# Patient Record
Sex: Male | Born: 1960 | Race: White | Hispanic: No | Marital: Single | Smoking: Never smoker
Health system: Southern US, Community
[De-identification: ages and names within clinical notes are randomized; demographics above are authoritative.]

## PROBLEM LIST (undated history)

## (undated) ENCOUNTER — Emergency Department (HOSPITAL_COMMUNITY): Disposition: A | Discharge status: Home / Self Care | Payer: Self-pay

## (undated) DIAGNOSIS — G039 Meningitis, unspecified: Secondary | ICD-10-CM

## (undated) DIAGNOSIS — M549 Dorsalgia, unspecified: Secondary | ICD-10-CM

## (undated) HISTORY — PX: KNEE SURGERY: SHX244

## (undated) HISTORY — PX: ELBOW SURGERY: SHX618

## (undated) HISTORY — PX: SHOULDER SURGERY: SHX246

---

## 2015-01-02 ENCOUNTER — Ambulatory Visit: Payer: Self-pay | Admitting: Sports Medicine

## 2015-01-21 ENCOUNTER — Ambulatory Visit: Payer: Self-pay | Admitting: Sports Medicine

## 2015-04-20 ENCOUNTER — Telehealth: Payer: Self-pay | Admitting: Internal Medicine

## 2015-04-20 ENCOUNTER — Ambulatory Visit (INDEPENDENT_AMBULATORY_CARE_PROVIDER_SITE_OTHER): Payer: BC Managed Care – PPO | Admitting: Internal Medicine

## 2015-04-20 VITALS — BP 120/80 | HR 75 | Temp 98.5°F | Ht 73.25 in | Wt 200.0 lb

## 2015-04-20 DIAGNOSIS — J988 Other specified respiratory disorders: Secondary | ICD-10-CM | POA: Diagnosis not present

## 2015-04-20 DIAGNOSIS — J22 Unspecified acute lower respiratory infection: Secondary | ICD-10-CM

## 2015-04-20 MED ORDER — AZITHROMYCIN 250 MG PO TABS: ORAL_TABLET | ORAL | Status: DC

## 2015-04-20 MED ORDER — HYDROCODONE-HOMATROPINE 5-1.5 MG/5ML PO SYRP: 5.0000 mL | ORAL_SOLUTION | Freq: Four times a day (QID) | ORAL | Status: DC | PRN

## 2015-04-20 NOTE — Progress Notes (Signed)
   Subjective:  By signing my name below, I, Stann Oresung-Kai Tsai, attest that this documentation has been prepared under the direction and in the presence of Ellamae Siaobert Deneen Slager, MD. Electronically Signed: Stann Oresung-Kai Tsai, Scribe. 04/20/2015 , 12:14 PM .  Patient was seen in Room 12 .   Patient ID: Bobby Miles, male    DOB: [birth year], 55 y.o.   MRN: [id number] Chief Complaint  Patient presents with  . Cough    flu like symptomsx 9-10 days, now cough-productive w/yellow to dark gray product  . Chills    felt hot/cold   . Sore Throat   HPI Bobby Miles is a 55 y.o. male who [facility] complaining of productive (yellow to dark gray) coughs with flu-like symptoms that started 9-10 days ago. He went to [place] to visit family and returned about 10 days ago. He also notes having chills and feeling fatigue. He had sore throat and fever but this has resolved. His wife has similar but worse symptoms. He denies shortness of breath.   There are no active problems to display for this patient.  No current outpatient prescriptions on file.  Review of Systems  Constitutional: Positive for chills and fatigue. Negative for fever.  HENT: Positive for congestion. Negative for sinus pressure, sneezing and sore throat.   Respiratory: Positive for cough. Negative for chest tightness, shortness of breath and wheezing.   Gastrointestinal: Negative for nausea, vomiting and abdominal pain.       Objective:   Physical Exam  Constitutional: He is oriented to person, place, and time. He appears well-developed and well-nourished. No distress.  HENT:  Head: Normocephalic and atraumatic.  Nose: Nose normal. No rhinorrhea.  Mouth/Throat: Oropharynx is clear and moist. No oropharyngeal exudate.  Eyes: EOM are normal. Pupils are equal, round, and reactive to light.  Neck: Neck supple.  Cardiovascular: Normal rate.   Pulmonary/Chest: Effort normal. No respiratory distress. He has no wheezes. He has rhonchi  (bilaterally ).  Musculoskeletal: Normal range of motion.  Lymphadenopathy:    He has no cervical adenopathy.  Neurological: He is alert and oriented to person, place, and time.  Skin: Skin is warm and dry.  Psychiatric: He has a normal mood and affect. His behavior is normal.  Nursing note and vitals reviewed.   BP 120/80 mmHg  Pulse 75  Temp(Src) 98.5 F (36.9 C) (Oral)  Ht 6' 1.25" (1.861 m)  Wt 200 lb (90.719 kg)  BMI 26.19 kg/m2  SpO2 98%     Assessment & Plan:  I have completed the patient encounter in its entirety as documented by the scribe, with editing by me where necessary. Hennessey Cantrell P. Merla Richesoolittle, M.D. Lower respiratory infection Meds ordered this encounter  Medications  . azithromycin (ZITHROMAX) 250 MG tablet    Sig: As packaged    Dispense:  6 tablet    Refill:  0  . HYDROcodone-homatropine (HYCODAN) 5-1.5 MG/5ML syrup    Sig: Take 5 mLs by mouth every 6 (six) hours as needed.    Dispense:  240 mL    Refill:  0   Fu 1 week not well

## 2015-04-21 NOTE — Telephone Encounter (Signed)
xxx

## 2015-10-28 ENCOUNTER — Ambulatory Visit: Payer: Self-pay | Admitting: Sports Medicine

## 2015-10-29 ENCOUNTER — Ambulatory Visit (INDEPENDENT_AMBULATORY_CARE_PROVIDER_SITE_OTHER): Payer: BC Managed Care – PPO | Admitting: Sports Medicine

## 2015-10-29 ENCOUNTER — Encounter: Payer: Self-pay | Admitting: Sports Medicine

## 2015-10-29 ENCOUNTER — Ambulatory Visit
Admission: RE | Admit: 2015-10-29 | Discharge: 2015-10-29 | Disposition: A | Discharge status: Home / Self Care | Payer: BC Managed Care – PPO | Source: Ambulatory Visit | Attending: Sports Medicine | Admitting: Sports Medicine

## 2015-10-29 VITALS — BP 114/83 | Ht 73.0 in | Wt 200.0 lb

## 2015-10-29 DIAGNOSIS — M545 Low back pain, unspecified: Secondary | ICD-10-CM
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
## 2015-10-29 NOTE — Assessment & Plan Note (Signed)
Most likely he had an underlying organic cause to his back pain with his long history.  He has good strength and range of motion which is contributed from his participation in yoga.  - Lumbar x-rays  - encouraged continuing exercises and stretching.  - can take NSAIDS as needed for pain.  - pending x-rays may need to move further to MRI lumbar spine.

## 2015-10-29 NOTE — Patient Instructions (Signed)
Thank you for coming in,   Try to do the [facility] exercises.  Perform knee to chest, knee to opposite shoulder, and arm and leg extensions.   I will call with the results from today.   Keep up with the yoga.    Please feel free to call with any questions or concerns at any time, at [phone]. --Dr. Jordan LikesSchmitz

## 2015-10-30 ENCOUNTER — Telehealth: Payer: Self-pay | Admitting: Family Medicine

## 2015-10-30 NOTE — Telephone Encounter (Signed)
Gave the patient the results of his xrays and also letter for yoga therapy

## 2015-10-30 NOTE — Telephone Encounter (Signed)
Left VM for patient. If he calls back please have him speak with a nurse/CMA and inform that his x-rays show a loss of normal curative of his spine in the lumbar region and Dr. Darrick PennaFields thinks he probably had some spondylitic change at L5/S1 that has healed. There was also some mild bone spurring observed.  We will continue the exercises that we provided for the next 6 to 12 weeks and he should follow up if there is no improvement.    If any questions then please take the best time and phone number to call and I will try to call him back.   Myra RudeJeremy E Schmitz, MD PGY-4, [facility] 10/30/2015, 11:10 AM

## 2016-07-07 ENCOUNTER — Other Ambulatory Visit: Payer: Self-pay | Admitting: Family Medicine

## 2016-07-07 DIAGNOSIS — N50819 Testicular pain, unspecified: Secondary | ICD-10-CM

## 2017-05-24 ENCOUNTER — Encounter: Payer: Self-pay | Admitting: Sports Medicine

## 2017-05-24 ENCOUNTER — Ambulatory Visit
Admission: RE | Admit: 2017-05-24 | Discharge: 2017-05-24 | Disposition: A | Discharge status: Home / Self Care | Payer: BC Managed Care – PPO | Source: Ambulatory Visit | Attending: Sports Medicine | Admitting: Sports Medicine

## 2017-05-24 ENCOUNTER — Ambulatory Visit: Payer: BC Managed Care – PPO | Admitting: Sports Medicine

## 2017-05-24 VITALS — BP 125/90 | Ht 73.0 in | Wt 200.0 lb

## 2017-05-24 DIAGNOSIS — G8929 Other chronic pain: Secondary | ICD-10-CM

## 2017-05-24 DIAGNOSIS — M25511 Pain in right shoulder: Secondary | ICD-10-CM

## 2017-05-24 DIAGNOSIS — M25512 Pain in left shoulder: Secondary | ICD-10-CM

## 2017-05-24 NOTE — Patient Instructions (Signed)
It was nice meeting you today Mr. Gwendolyn FillMcGuire!  We will call you when the results of your xrays are available to discuss the next steps in your treatment plan.   If you have any questions or concerns, please feel free to call the clinic.   Be well,  Dr. Natale MilchLancaster

## 2017-05-24 NOTE — Assessment & Plan Note (Signed)
Possibly 2/2 prior surgeries, though no abnormalities noted on ultrasound. No arthritic changes noted. No weakness on exam, so less likely rotator cuff injury. Avascular necrosis is included on differential given no other obvious etiology and no physical exam abnormalities, however no history of excessive steroid use or other obvious precipitating factor. As no findings on ultrasound, will proceed with bilateral shoulder plain film series. If no cause for pain noted on plain film, will continue with MRI. Will call patient when xray results available to discuss next steps of treatment plan.

## 2017-05-24 NOTE — Progress Notes (Signed)
   Subjective:    Patient ID: Bobby Miles, male    DOB: June 06, 1960, 57 y.o.   MRN: [id number]  HPI  Bobby DaftMichael Barrientez is a 57yo man with history of bilateral shoulder surgery presenting with chronic shoulder pain.  Has had bilateral shoulder pain for many years, but has recently worsened over the past 6 months. Cannot identify precise location of pain, but says pain is more generalized. Initially he was having difficulty sleeping at night due to pain, which has improved with activity modifications. Patient has stopped doing pushups and other exercises which seem to strain his shoulders at the gym with improvement in symptoms, however if he tries to resume these exercises the pain quickly returns. Also has both pain and weakness when reaching for objects behind him, even light objects such as a pillow. Has not taken any medications for the pain, or used ice or heat. Denies redness or swelling of shoulders. In the past has received injections for shoulder pain, which he says were very helpful for 1-2 months, however he is interested in a long term solution rather than a temporary fix. Is interested in getting imaging today to try to identify the problem.  Of note, shoulder surgeries ~10-15 years ago. Is unsure exactly why he had surgery, however says he remembers being told her had a bone spur, rotator cuff damage, calcium build up, and a portion of his scapula removed. Has a history of back pain as well for which he was seen here in 2017. Started doing yoga which has significant improved his back pain, though does aggravate his shoulders at times.   Review of Systems MSK: Endorses weakness of upper extremities bilaterally. Denies numbness, tingling of upper extremities.  Skin: Denies redness or swelling of shoulders bilaterally.     Objective:   Physical Exam  Constitutional: He is oriented to person, place, and time. He appears well-developed and well-nourished. No distress.  Pulmonary/Chest:  Effort normal. No respiratory distress.  Musculoskeletal:  5/5 strength upper extremities bilaterally. Negative empty can, Hawkin's, Neer's. No TTP. Mild asymmetry suggestive of possible atrophy on L shoulder.   Neurological: He is alert and oriented to person, place, and time.  Psychiatric: He has a normal mood and affect. His behavior is normal.      Assessment & Plan:  Chronic pain of both shoulders Possibly 2/2 prior surgeries, though no abnormalities noted on ultrasound. No arthritic changes noted. No weakness on exam, so less likely rotator cuff injury. Avascular necrosis is included on differential given no other obvious etiology and no physical exam abnormalities, however no history of excessive steroid use or other obvious precipitating factor. As no findings on ultrasound, will proceed with bilateral shoulder plain film series. If no cause for pain noted on plain film, will continue with MRI. Will call patient when xray results available to discuss next steps of treatment plan.   Tarri AbernethyAbigail J Lancaster, MD, MPH PGY-3 [facility] Pager [phone]  I observed and examined the patient with the resident and agree with assessment and plan.  Note reviewed and modified by me. Sterling BigKB Adelae Yodice, MD

## 2017-12-15 ENCOUNTER — Other Ambulatory Visit: Payer: Self-pay | Admitting: *Deleted

## 2017-12-15 DIAGNOSIS — M25511 Pain in right shoulder: Principal | ICD-10-CM

## 2017-12-15 DIAGNOSIS — M25512 Pain in left shoulder: Principal | ICD-10-CM

## 2017-12-15 DIAGNOSIS — G8929 Other chronic pain: Secondary | ICD-10-CM

## 2018-01-17 ENCOUNTER — Other Ambulatory Visit: Payer: BC Managed Care – PPO

## 2018-01-17 ENCOUNTER — Ambulatory Visit
Admission: RE | Admit: 2018-01-17 | Discharge: 2018-01-17 | Disposition: A | Discharge status: Home / Self Care | Payer: BC Managed Care – PPO | Source: Ambulatory Visit | Attending: Sports Medicine | Admitting: Sports Medicine

## 2018-01-17 DIAGNOSIS — M25511 Pain in right shoulder: Principal | ICD-10-CM

## 2018-01-17 DIAGNOSIS — M25512 Pain in left shoulder: Principal | ICD-10-CM

## 2018-01-17 DIAGNOSIS — G8929 Other chronic pain: Secondary | ICD-10-CM

## 2018-01-24 ENCOUNTER — Ambulatory Visit
Admission: RE | Admit: 2018-01-24 | Discharge: 2018-01-24 | Disposition: A | Discharge status: Home / Self Care | Payer: BC Managed Care – PPO | Source: Ambulatory Visit | Attending: Sports Medicine | Admitting: Sports Medicine

## 2018-01-24 DIAGNOSIS — M25511 Pain in right shoulder: Principal | ICD-10-CM

## 2018-01-24 DIAGNOSIS — G8929 Other chronic pain: Secondary | ICD-10-CM

## 2018-01-24 DIAGNOSIS — M25512 Pain in left shoulder: Principal | ICD-10-CM

## 2018-02-07 ENCOUNTER — Ambulatory Visit: Payer: BC Managed Care – PPO | Admitting: Sports Medicine

## 2018-02-07 VITALS — BP 118/86 | Ht 73.0 in | Wt 200.0 lb

## 2018-02-07 DIAGNOSIS — M7581 Other shoulder lesions, right shoulder: Secondary | ICD-10-CM | POA: Diagnosis not present

## 2018-02-07 NOTE — Progress Notes (Signed)
   HPI  CC: Bilateral shoulder pain  Bobby Miles is a 57 year old male who presents for bilateral shoulder pain.  This is the same pain he has had for multiple years.  He underwent a bilateral acromioplasty around 3 years ago.  He has had a lingering shoulder pain since that time.  He was last seen in February in this clinic.  He had an MRI ordered at that time, which was done this month.  This showed tendinopathy of the supraspinatus muscle on bilateral shoulders as well as in for spinatus tendinopathy on the right shoulder.  He also had some acromial bursitis seen on MRI.  He states the pain is worse when he is in overhead activity.  He states also worse when he is lying on his right side at nighttime.  He is taken Aleve occasionally with some relief.  He denies any new trauma to the area.  See HPI and/or previous note for associated ROS.  Objective: BP 118/86   Ht 6\' 1"  (1.854 m)   Wt 200 lb (90.7 kg)   BMI 26.39 kg/m  Gen: Right-Hand Dominant. NAD, well groomed, a/o x3, normal affect.  CV: Well-perfused. Warm.  Resp: Non-labored.  Neuro: Sensation intact throughout. No gross coordination deficits.   Bilateral shoulder exam: No erythema, warmth, swelling noted.  Tenderness palpation over the bicipital groove and bilateral shoulders.  Full range of motion in forward flexion, abduction, internal and external rotation.  Strength 5 out of 5 throughout testing.  Some pain with abduction.  Positive Hawkins test bilaterally, negative speeds test bilaterally, positive empty can test bilaterally, negative crossover test, negative liftoff test, negative O'Brien's bilaterally.   Assessment and plan:  Bilateral shoulder pain, likely secondary to rotator cuff tendinopathy.  We discussed treatment options today with Bobby Miles.  We talked about doing subacromial injections into his shoulders, to give him relief to perform home exercises for rehabilitation.  He seemed amenable to this treatment plan.   Bobby Miles did leave his appointment early before the injections could be given.  I have called left a voicemail for follow-up appointment.  At that appointment I would consider discussing a subacromial injections with him again as well as given him a home exercise regimen for shoulder pain.  Alric Quan, MD [facility] Sports Medicine Fellow 02/07/2018 1:39 PM

## 2019-03-15 IMAGING — CR DG SHOULDER 2+V*R*
3 series · 3 of 3 positions shown · non-contrast
Comparison: None.

CLINICAL DATA: Chronic bilat shoulder pain w decreased ROM, past hx
of surg several yrs ago, no recent injury

EXAM:
RIGHT SHOULDER - 2+ VIEW

[w shoulder ap internal righ]
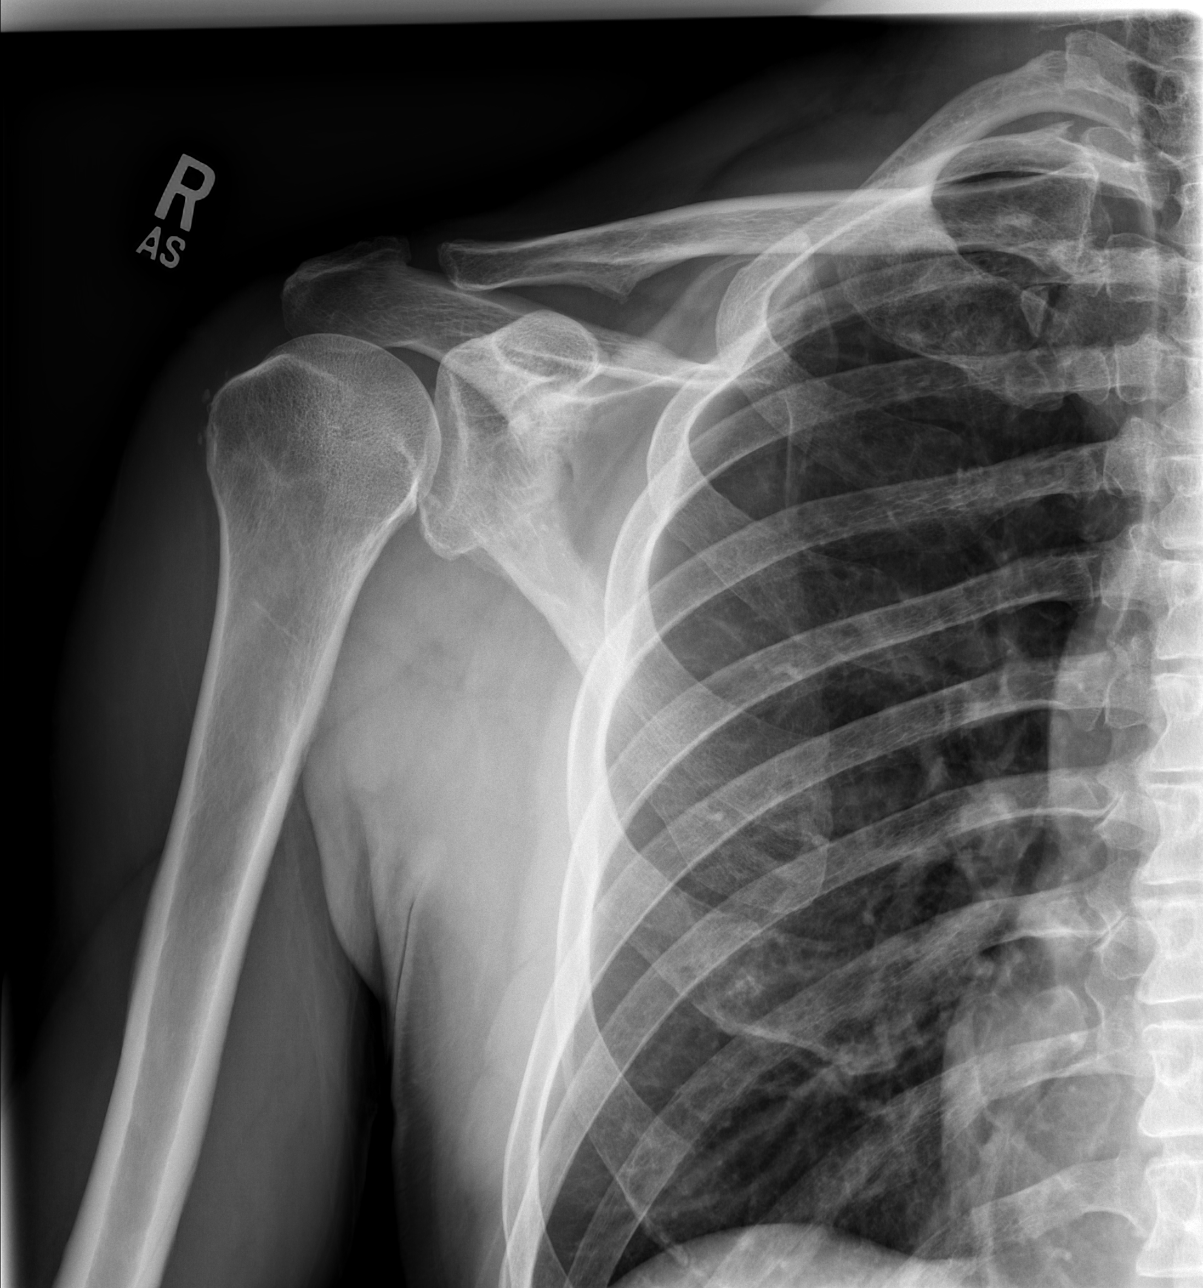

[w shoulder y view right]
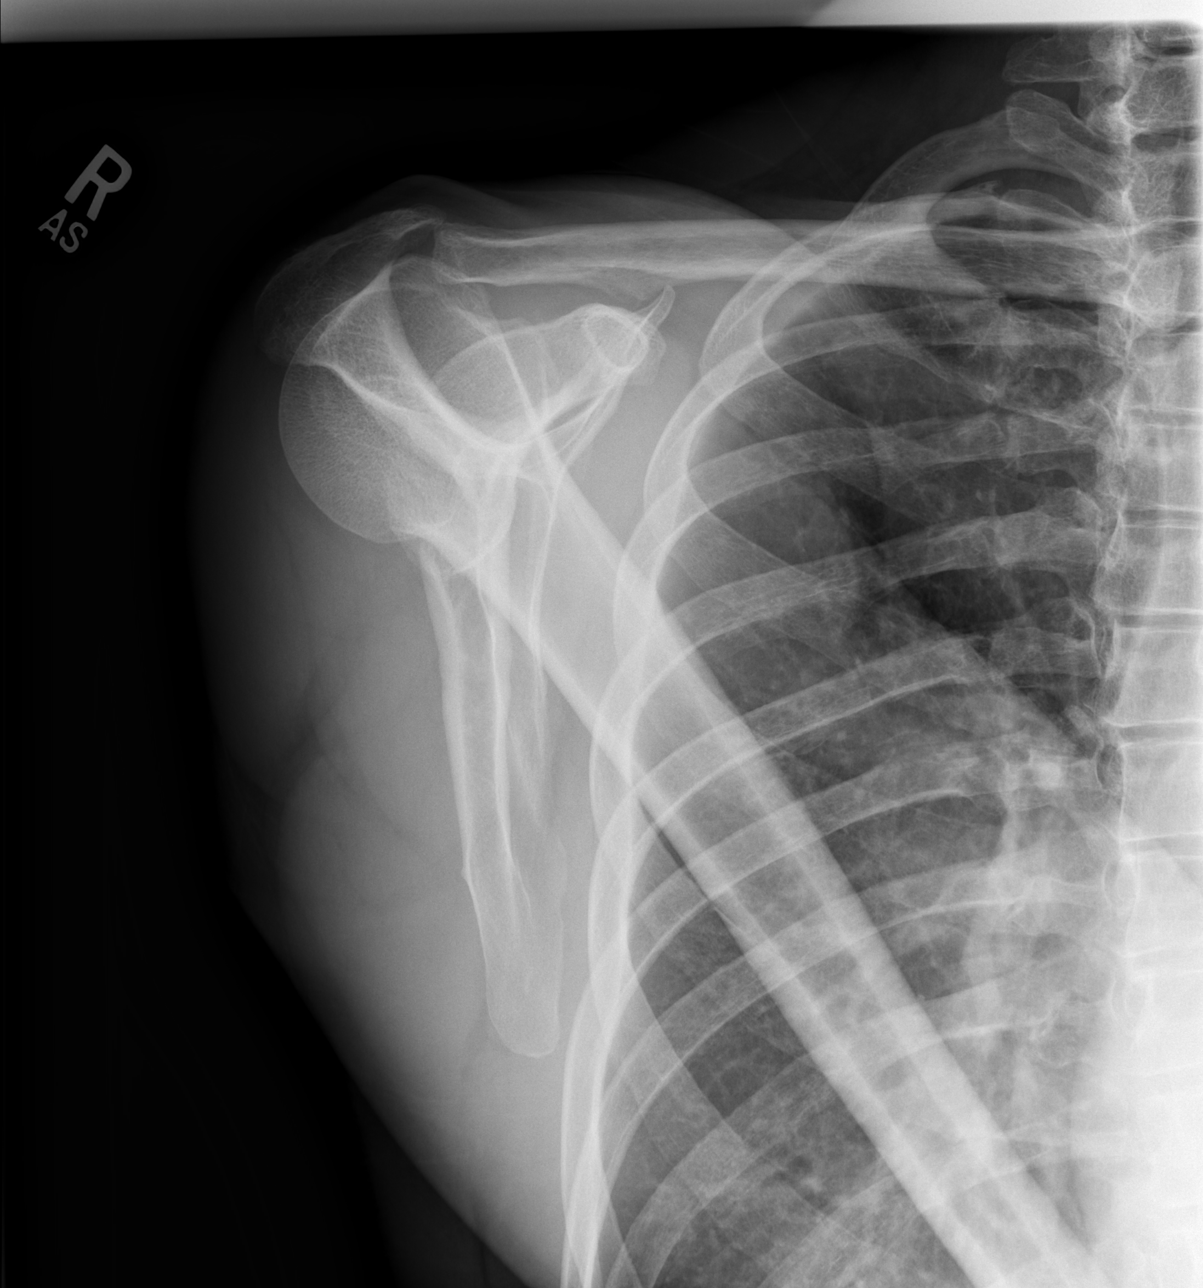

[w shoulder axillary right *]
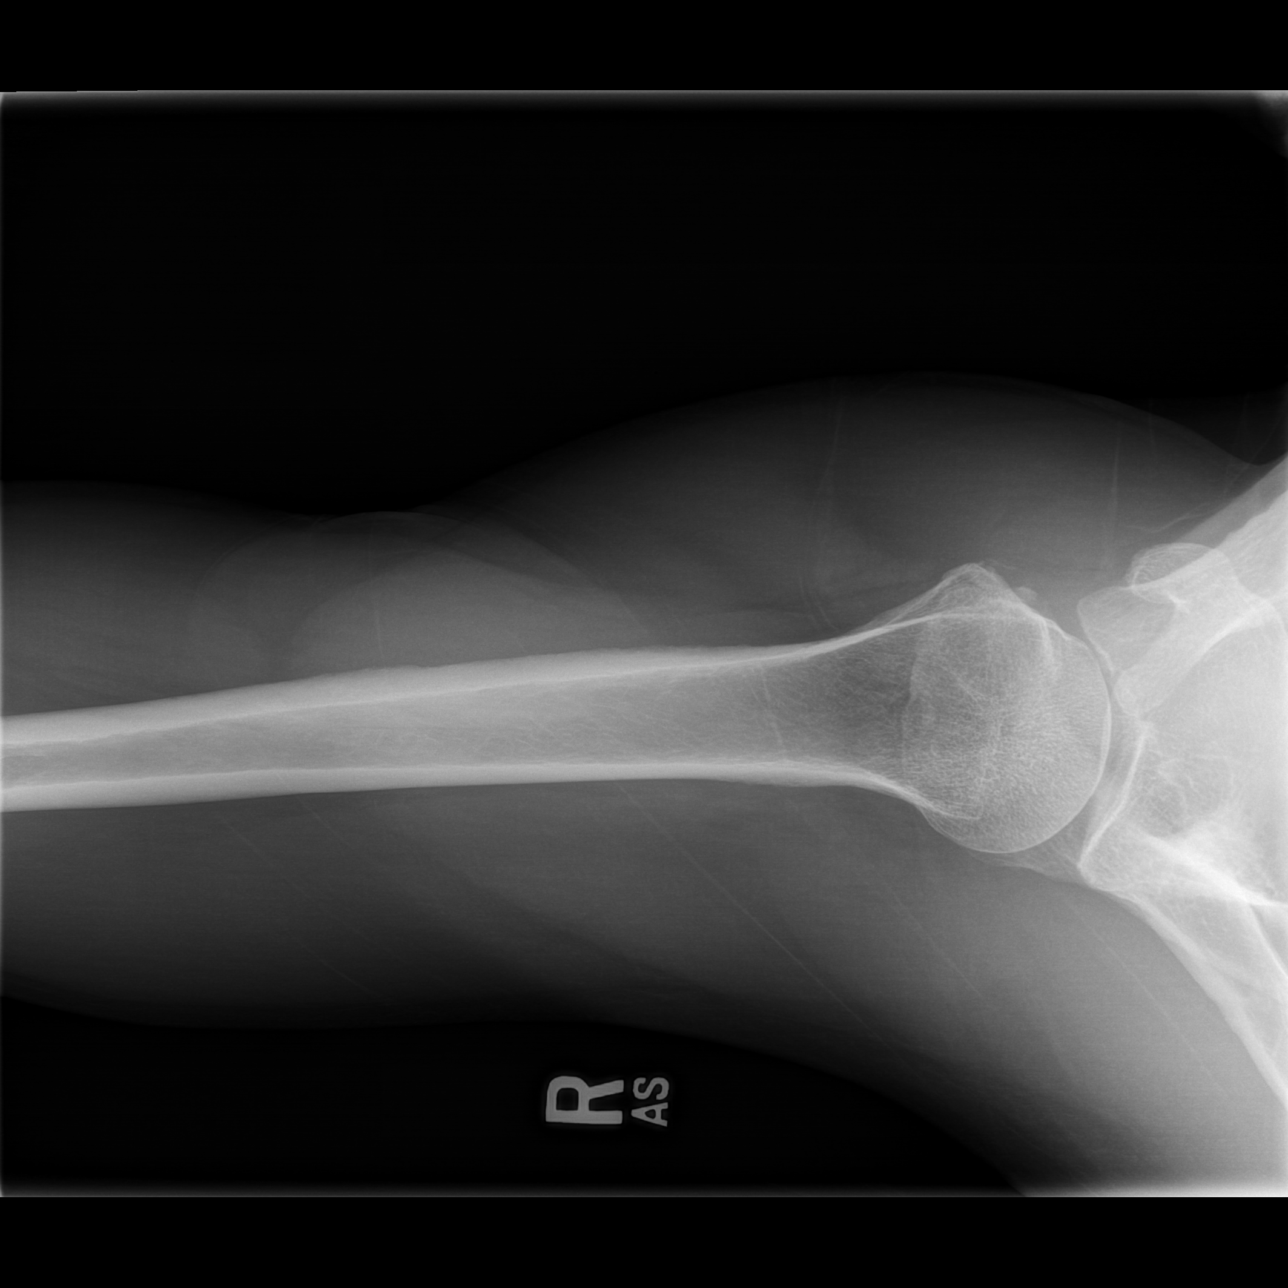

[3 of 3 positions shown; findings below may reference images not displayed]

FINDINGS: No fracture, bone lesion or dislocation.

The glenohumeral joint is normally spaced and aligned.

Mild widening of the AC joint. The acromion is decreased in size,
particularly its superior to inferior with, consistent with a prior
acromioplasty.

Soft tissues are unremarkable.
IMPRESSION: 1. No fracture or dislocation.
2. Changes consistent with the given history of previous right
shoulder surgery.
3. No significant arthropathic change.

## 2019-03-15 IMAGING — CR DG SHOULDER 2+V*L*
3 series · 3 of 3 positions shown · non-contrast
Comparison: None.

CLINICAL DATA: Chronic bilat shoulder pain w decreased ROM, past hx
of surg several yrs ago, no recent injury

EXAM:
LEFT SHOULDER - 2+ VIEW

[w shoulder ap internal left]
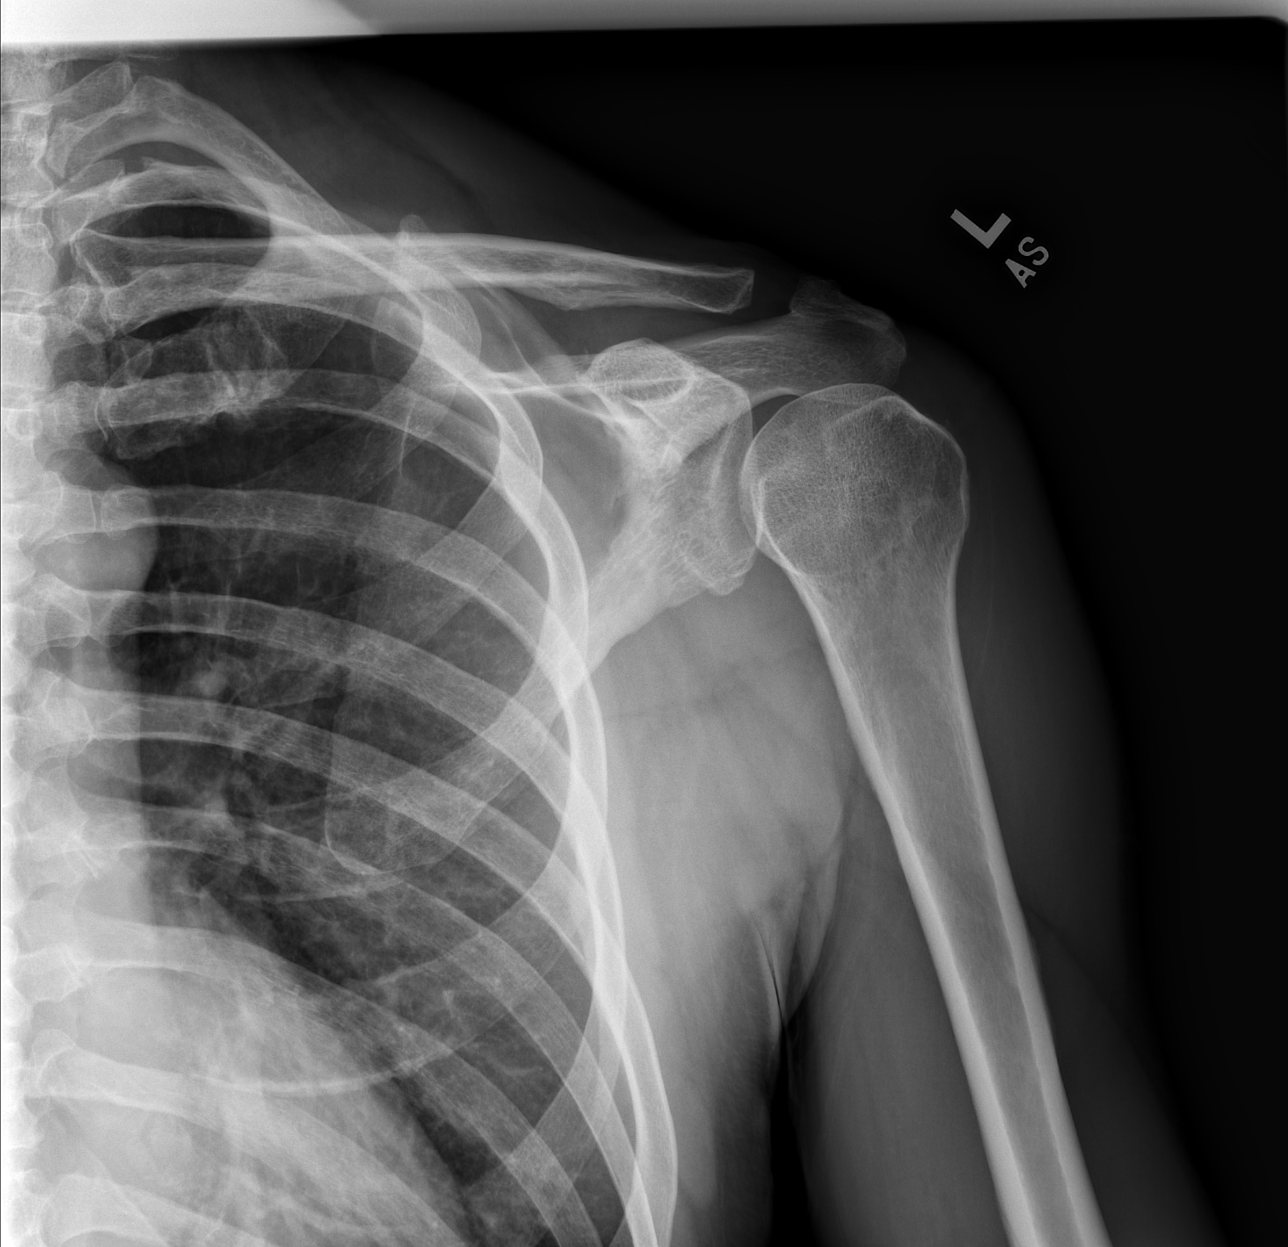

[w shoulder y view left]
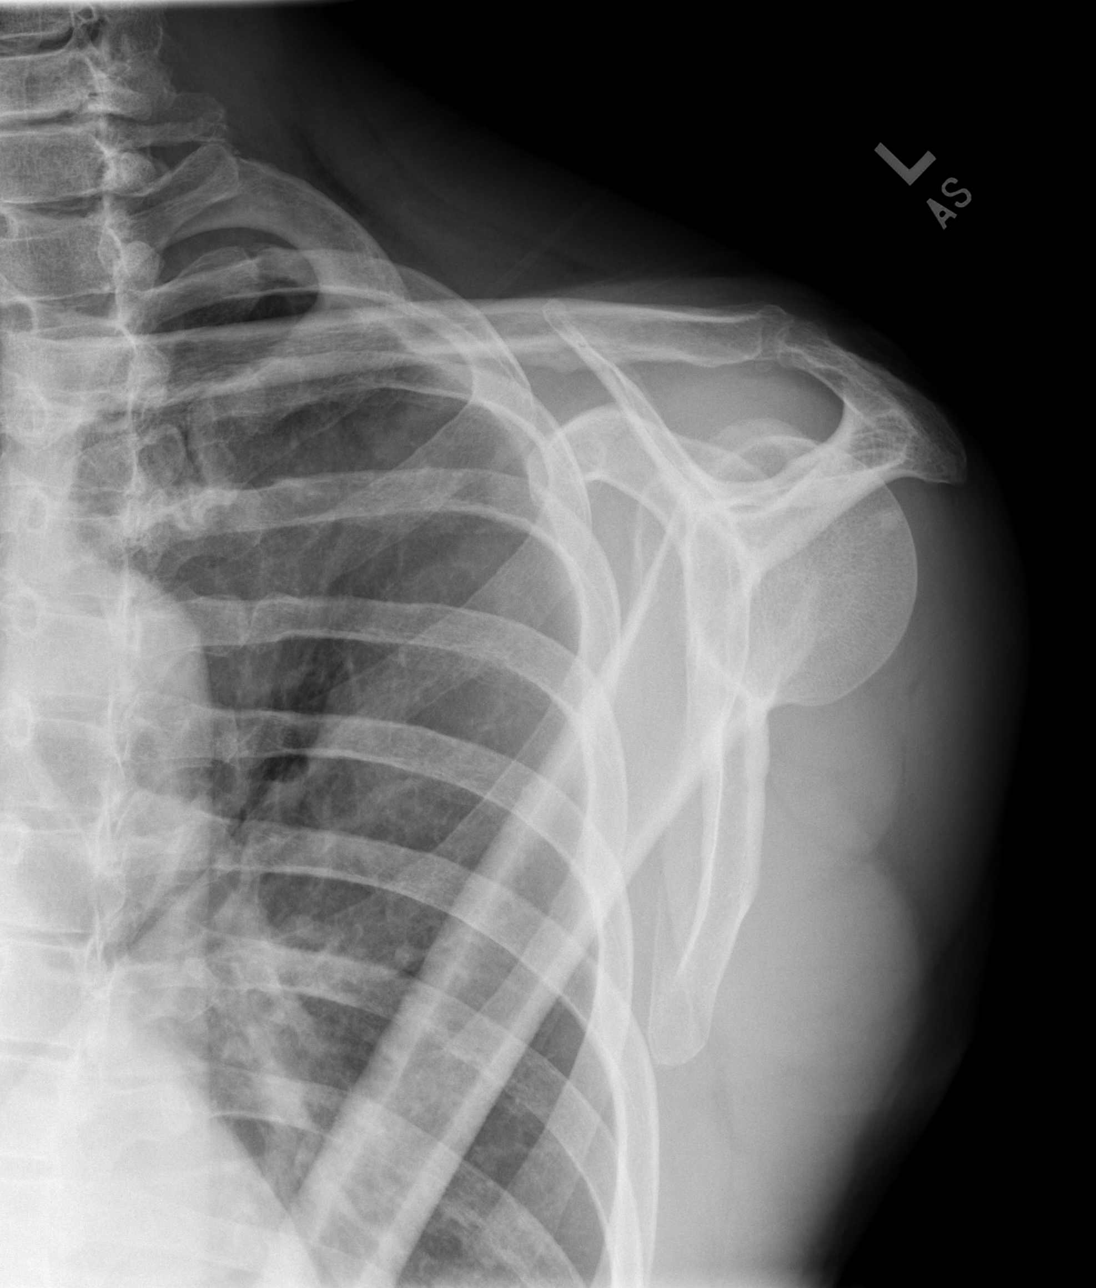

[w shoulder axillary left *]
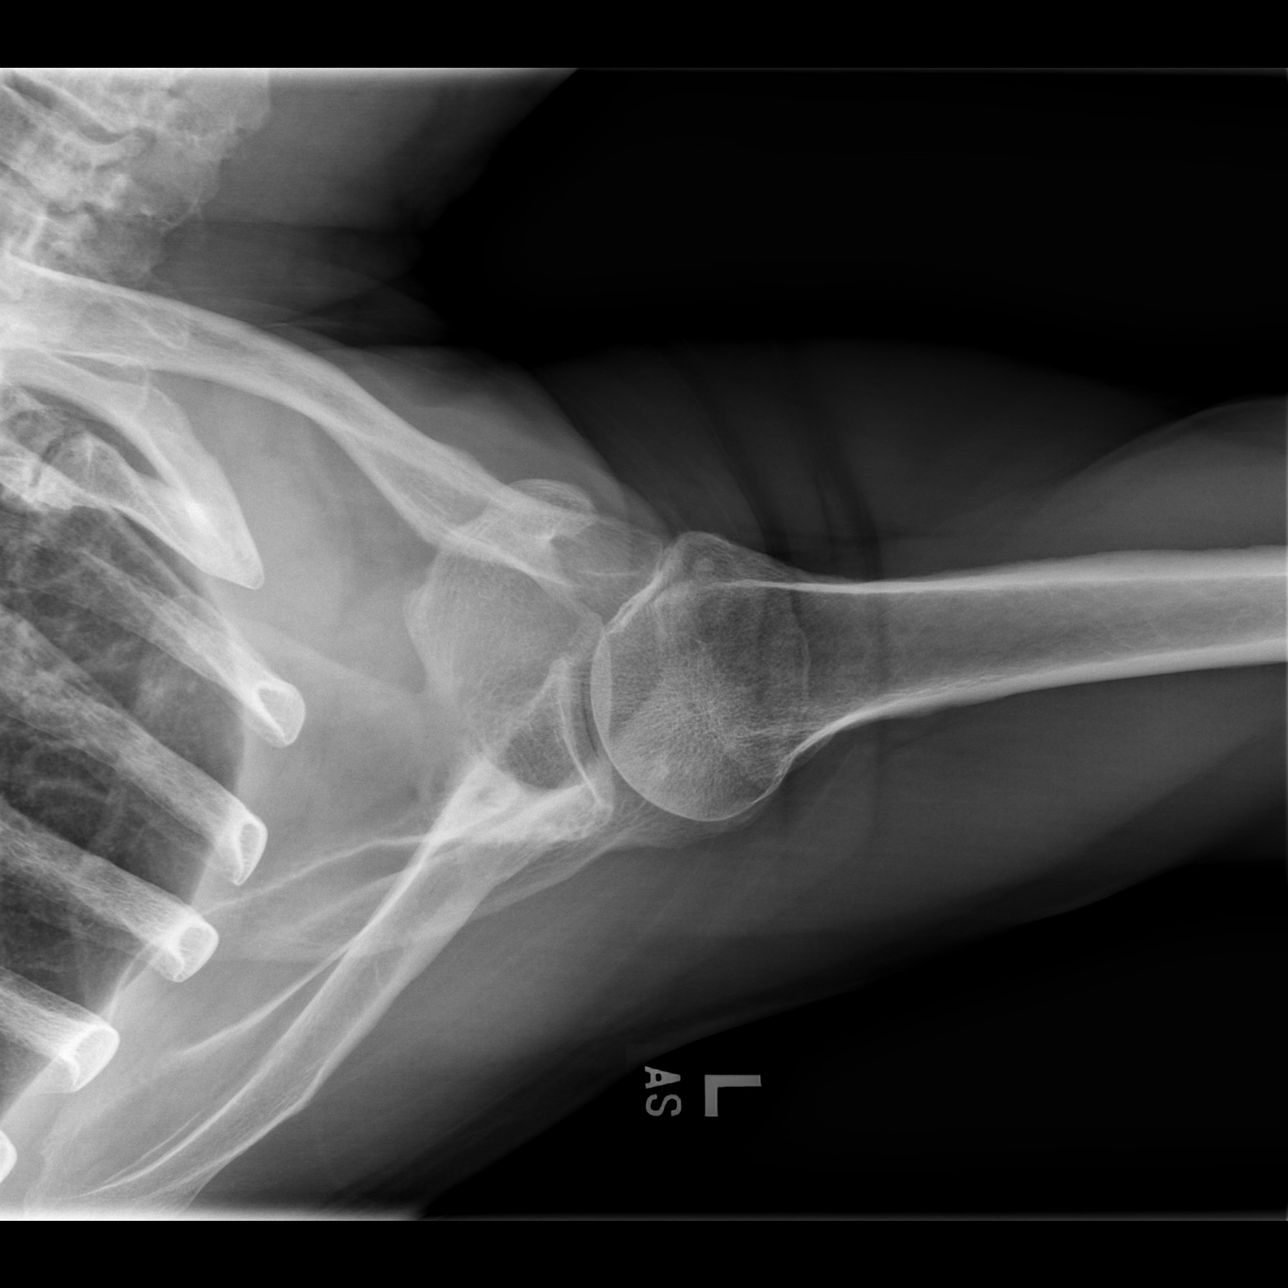

[3 of 3 positions shown; findings below may reference images not displayed]

FINDINGS: No fracture.  No bone lesion.

Glenohumeral joint is normally spaced and aligned.

Mild widening of the AC joint. There has likely been a previous
acromioplasty. The AC joint is normally aligned.

Soft tissues are unremarkable.
IMPRESSION: 1. No fracture, dislocation or acute finding.
2. No significant arthropathic changes.
3. Widened AC joint likely due to the reported prior shoulder
surgery.

## 2020-05-09 ENCOUNTER — Encounter: Payer: Self-pay | Admitting: Physician Assistant

## 2020-05-09 ENCOUNTER — Telehealth: Payer: BC Managed Care – PPO | Admitting: Physician Assistant

## 2020-05-09 DIAGNOSIS — M545 Low back pain, unspecified: Secondary | ICD-10-CM

## 2020-05-09 MED ORDER — PREDNISONE 20 MG PO TABS: ORAL_TABLET | ORAL | 0 refills | Status: AC

## 2020-05-09 MED ORDER — CYCLOBENZAPRINE HCL 10 MG PO TABS: 10.0000 mg | ORAL_TABLET | Freq: Three times a day (TID) | ORAL | 0 refills | Status: DC | PRN

## 2020-05-09 MED ORDER — DICLOFENAC SODIUM 75 MG PO TBEC: 75.0000 mg | DELAYED_RELEASE_TABLET | Freq: Two times a day (BID) | ORAL | 0 refills | Status: DC | PRN

## 2020-05-09 NOTE — Patient Instructions (Signed)
For your lower left back pain, I encourage you to take prednisone as directed, use the muscle relaxer 3 times a day as needed, and use the diclofenac twice a day as needed.  The diclofenac will help with pain but is also an anti-inflammatory so we will also help with healing.  I did put in information regarding sciatica, the recommendations for that are fairly similar to if this was just back pain or muscle strain.  Please let us know if this does not resolve or worsens.  I hope that you feel better soon  Roney Jaffe, PA-C Physician Assistant [facility] [email or web address]   Sciatica  Sciatica is pain, numbness, weakness, or tingling along the path of the sciatic nerve. The sciatic nerve starts in the lower back and runs down the back of each leg. The nerve controls the muscles in the lower leg and in the back of the knee. It also provides feeling (sensation) to the back of the thigh, the lower leg, and the sole of the foot. Sciatica is a symptom of another medical condition that pinches or puts pressure on the sciatic nerve. Sciatica most often only affects one side of the body. Sciatica usually goes away on its own or with treatment. In some cases, sciatica may come back (recur). What are the causes? This condition is caused by pressure on the sciatic nerve or pinching of the nerve. This may be the result of:  A disk in between the bones of the spine bulging out too far (herniated disk).  Age-related changes in the spinal disks.  A pain disorder that affects a muscle in the buttock.  Extra bone growth near the sciatic nerve.  A break (fracture) of the pelvis.  Pregnancy.  Tumor. This is rare. What increases the risk? The following factors may make you more likely to develop this condition:  Playing sports that place pressure or stress on the spine.  Having poor strength and flexibility.  A history of back injury or  surgery.  Sitting for long periods of time.  Doing activities that involve repetitive bending or lifting.  Obesity. What are the signs or symptoms? Symptoms can vary from mild to very severe, and they may include:  Any of these problems in the lower back, leg, hip, or buttock: ? Mild tingling, numbness, or dull aches. ? Burning sensations. ? Sharp pains.  Numbness in the back of the calf or the sole of the foot.  Leg weakness.  Severe back pain that makes movement difficult. Symptoms may get worse when you cough, sneeze, or laugh, or when you sit or stand for long periods of time. How is this diagnosed? This condition may be diagnosed based on:  Your symptoms and medical history.  A physical exam.  Blood tests.  Imaging tests, such as: ? X-rays. ? MRI. ? CT scan. How is this treated? In many cases, this condition improves on its own without treatment. However, treatment may include:  Reducing or modifying physical activity.  Exercising and stretching.  Icing and applying heat to the affected area.  Medicines that help to: ? Relieve pain and swelling. ? Relax your muscles.  Injections of medicines that help to relieve pain, irritation, and inflammation around the sciatic nerve (steroids).  Surgery. Follow these instructions at home: Medicines  Take over-the-counter and prescription medicines only as told by your health care provider.  Ask your health care provider if the medicine prescribed to you: ? Requires you to avoid driving  or using heavy machinery. ? Can cause constipation. You may need to take these actions to prevent or treat constipation:  Drink enough fluid to keep your urine pale yellow.  Take over-the-counter or prescription medicines.  Eat foods that are high in fiber, such as beans, whole grains, and fresh fruits and vegetables.  Limit foods that are high in fat and processed sugars, such as fried or sweet foods. Managing pain  If  directed, put ice on the affected area. ? Put ice in a plastic bag. ? Place a towel between your skin and the bag. ? Leave the ice on for 20 minutes, 2-3 times a day.  If directed, apply heat to the affected area. Use the heat source that your health care provider recommends, such as a moist heat pack or a heating pad. ? Place a towel between your skin and the heat source. ? Leave the heat on for 20-30 minutes. ? Remove the heat if your skin turns bright red. This is especially important if you are unable to feel pain, heat, or cold. You may have a greater risk of getting burned.      Activity  Return to your normal activities as told by your health care provider. Ask your health care provider what activities are safe for you.  Avoid activities that make your symptoms worse.  Take brief periods of rest throughout the day. ? When you rest for longer periods, mix in some mild activity or stretching between periods of rest. This will help to prevent stiffness and pain. ? Avoid sitting for long periods of time without moving. Get up and move around at least one time each hour.  Exercise and stretch regularly, as told by your health care provider.  Do not lift anything that is heavier than 10 lb (4.5 kg) while you have symptoms of sciatica. When you do not have symptoms, you should still avoid heavy lifting, especially repetitive heavy lifting.  When you lift objects, always use proper lifting technique, which includes: ? Bending your knees. ? Keeping the load close to your body. ? Avoiding twisting.   General instructions  Maintain a healthy weight. Excess weight puts extra stress on your back.  Wear supportive, comfortable shoes. Avoid wearing high heels.  Avoid sleeping on a mattress that is too soft or too hard. A mattress that is firm enough to support your back when you sleep may help to reduce your pain.  Keep all follow-up visits as told by your health care provider. This is  important. Contact a health care provider if:  You have pain that: ? Wakes you up when you are sleeping. ? Gets worse when you lie down. ? Is worse than you have experienced in the past. ? Lasts longer than 4 weeks.  You have an unexplained weight loss. Get help right away if:  You are not able to control when you urinate or have bowel movements (incontinence).  You have: ? Weakness in your lower back, pelvis, buttocks, or legs that gets worse. ? Redness or swelling of your back. ? A burning sensation when you urinate. Summary  Sciatica is pain, numbness, weakness, or tingling along the path of the sciatic nerve.  This condition is caused by pressure on the sciatic nerve or pinching of the nerve.  Sciatica can cause pain, numbness, or tingling in the lower back, legs, hips, and buttocks.  Treatment often includes rest, exercise, medicines, and applying ice or heat. This information is not intended to  replace advice given to you by your health care provider. Make sure you discuss any questions you have with your health care provider. Document Revised: 04/24/2018 Document Reviewed: 04/24/2018 Elsevier Patient Education  2021 [employer].

## 2020-05-09 NOTE — Progress Notes (Signed)
I connected with  Bobby Miles on 05/09/20 by a video enabled telemedicine application and verified that I am speaking with the correct person using two identifiers.   I discussed the limitations of evaluation and management by telemedicine. The patient expressed understanding and agreed to proceed.   Acute Office Visit  Subjective:    Patient ID: Bobby Miles, male    DOB: [birth year], 60 y.o.   MRN: [id number]  Chief Complaint  Patient presents with   Back Pain   Virtual Visit via Video Note  I connected with Bobby Miles on 05/09/20 at 10:30 AM EST by a video enabled telemedicine application and verified that I am speaking with the correct person using two identifiers.  Location: Patient: Home  Provider: Working remotely from home   I discussed the limitations of evaluation and management by telemedicine and the availability of in person appointments. The patient expressed understanding and agreed to proceed.  History of Present Illness:   Reports that he started having left sided lower back pain approx 5 days ago, reports that he does have a history of low back pain, states that this feels different than previous episodes.  Reports that it is painful to touch his lower back at times.  Reports pain as radiating from his hip up his back, does endorse radiation to his testicles.  Reports that he did participate in some new home exercises prior to this episode.  Otherwise denies injury or trauma.  Reports that he has been using ibuprofen 400 mg, heat and ice without much relief.  Denies dysuria, fevers, saddle anesthesia.  Reports that he has been drinking approximately 5 glasses of water a day  Observations/Objective: Medical history and current medications reviewed, no physical exam completed     History reviewed. No pertinent past medical history.  History reviewed. No pertinent surgical history.  History reviewed. No pertinent family history.  Social History    Socioeconomic History   Marital status: Single    Spouse name: Not on file   Number of children: Not on file   Years of education: Not on file   Highest education level: Not on file  Occupational History   Not on file  Tobacco Use   Smoking status: Never Smoker   Smokeless tobacco: Never Used  Substance and Sexual Activity   Alcohol use: Not on file   Drug use: Not on file   Sexual activity: Not on file  Other Topics Concern   Not on file  Social History Narrative   Not on file   Social Determinants of Health   Financial Resource Strain: Not on file  Food Insecurity: Not on file  Transportation Needs: Not on file  Physical Activity: Not on file  Stress: Not on file  Social Connections: Not on file  Intimate Partner Violence: Not on file    No outpatient medications prior to visit.   No facility-administered medications prior to visit.    No Known Allergies  Review of Systems  Constitutional: Negative for chills and fever.  HENT: Negative.   Eyes: Negative.   Respiratory: Negative.   Cardiovascular: Negative.   Gastrointestinal: Negative for diarrhea, nausea and vomiting.  Endocrine: Negative.   Genitourinary: Negative for dysuria, flank pain, frequency and penile pain.  Musculoskeletal: Positive for arthralgias and back pain.  Skin: Negative.   Allergic/Immunologic: Negative.   Neurological: Negative.   Hematological: Negative.   Psychiatric/Behavioral: Negative.        Objective:     There were no  vitals taken for this visit. Wt Readings from Last 3 Encounters:  02/07/18 200 lb (90.7 kg)  05/24/17 200 lb (90.7 kg)  10/29/15 200 lb (90.7 kg)    Health Maintenance Due  Topic Date Due   Hepatitis C Screening  Never done   HIV Screening  Never done   COLONOSCOPY (Pts 45-95yr Insurance coverage will need to be confirmed)  Never done   INFLUENZA VACCINE  11/18/2019   COVID-19 Vaccine (3 - Booster for Moderna series) 01/22/2020     There are no preventive care reminders to display for this patient.   No results found for: TSH No results found for: WBC, HGB, HCT, MCV, PLT No results found for: NA, K, CHLORIDE, CO2, GLUCOSE, BUN, CREATININE, BILITOT, ALKPHOS, AST, ALT, PROT, ALBUMIN, CALCIUM, ANIONGAP, EGFR, GFR No results found for: CHOL No results found for: HDL No results found for: LDLCALC No results found for: TRIG No results found for: CHOLHDL No results found for: HGBA1C     Assessment & Plan:   Problem List Items Addressed This Visit      Other   Acute left-sided low back pain without sciatica - Primary   Relevant Medications   diclofenac (VOLTAREN) 75 MG EC tablet   predniSONE (DELTASONE) 20 MG tablet   cyclobenzaprine (FLEXERIL) 10 MG tablet     Assessment and Plan:  1. Acute left-sided low back pain without sciatica Patient education given on RICE, gentle stretching, trial diclofenac, prednisone taper, Flexeril as needed.  Continue increased hydration.  Red flags given for prompt reevaluation  - diclofenac (VOLTAREN) 75 MG EC tablet; Take 1 tablet (75 mg total) by mouth 2 (two) times daily as needed.  Dispense: 30 tablet; Refill: 0 - predniSONE (DELTASONE) 20 MG tablet; Take 3 tablets (60 mg total) by mouth daily with breakfast for 2 days, THEN 2 tablets (40 mg total) daily with breakfast for 2 days, THEN 1 tablet (20 mg total) daily with breakfast for 2 days, THEN 0.5 tablets (10 mg total) daily with breakfast for 2 days.  Dispense: 13 tablet; Refill: 0 - cyclobenzaprine (FLEXERIL) 10 MG tablet; Take 1 tablet (10 mg total) by mouth 3 (three) times daily as needed for muscle spasms.  Dispense: 30 tablet; Refill: 0   Follow Up Instructions:    I discussed the assessment and treatment plan with the patient. The patient was provided an opportunity to ask questions and all were answered. The patient agreed with the plan and demonstrated an understanding of the instructions.   The patient was  advised to call back or seek an in-person evaluation if the symptoms worsen or if the condition fails to improve as anticipated.  I provided 21 minutes of non-face-to-face time during this encounter.    Meds ordered this encounter  Medications   diclofenac (VOLTAREN) 75 MG EC tablet    Sig: Take 1 tablet (75 mg total) by mouth 2 (two) times daily as needed.    Dispense:  30 tablet    Refill:  0    Order Specific Question:   Supervising Provider    Answer:   WElsie Stain[[id number]]   predniSONE (DELTASONE) 20 MG tablet    Sig: Take 3 tablets (60 mg total) by mouth daily with breakfast for 2 days, THEN 2 tablets (40 mg total) daily with breakfast for 2 days, THEN 1 tablet (20 mg total) daily with breakfast for 2 days, THEN 0.5 tablets (10 mg total) daily with breakfast for 2 days.  Dispense:  13 tablet    Refill:  0    Order Specific Question:   Supervising Provider    Answer:   Asencion Noble E [[id number]]   cyclobenzaprine (FLEXERIL) 10 MG tablet    Sig: Take 1 tablet (10 mg total) by mouth 3 (three) times daily as needed for muscle spasms.    Dispense:  30 tablet    Refill:  0    Order Specific Question:   Supervising Provider    Answer:   Asencion Noble E [[id number]]     Kathalina Ostermann Chauncey Cruel Mayers, PA-C

## 2020-05-11 ENCOUNTER — Ambulatory Visit (HOSPITAL_COMMUNITY)
Admission: EM | Admit: 2020-05-11 | Discharge: 2020-05-11 | Disposition: A | Discharge status: Home / Self Care | Payer: BC Managed Care – PPO | Attending: Student | Admitting: Student

## 2020-05-11 ENCOUNTER — Encounter (HOSPITAL_COMMUNITY): Payer: Self-pay | Admitting: *Deleted

## 2020-05-11 ENCOUNTER — Other Ambulatory Visit: Payer: Self-pay

## 2020-05-11 DIAGNOSIS — S39012D Strain of muscle, fascia and tendon of lower back, subsequent encounter: Secondary | ICD-10-CM | POA: Diagnosis not present

## 2020-05-11 DIAGNOSIS — M5432 Sciatica, left side: Secondary | ICD-10-CM | POA: Diagnosis not present

## 2020-05-11 HISTORY — DX: Dorsalgia, unspecified: M54.9

## 2020-05-11 HISTORY — DX: Meningitis, unspecified: G03.9

## 2020-05-11 MED ORDER — HYDROCODONE-ACETAMINOPHEN 5-325 MG PO TABS: 2.0000 | ORAL_TABLET | ORAL | 0 refills | Status: DC | PRN

## 2020-05-11 MED ORDER — METAXALONE 800 MG PO TABS: 800.0000 mg | ORAL_TABLET | Freq: Three times a day (TID) | ORAL | 0 refills | Status: DC

## 2020-05-11 NOTE — ED Triage Notes (Signed)
Pt reports having hx "back problems"; states noticed discomfort while exercising approx 6 days ago; the following day started with severe left low back pain radiating down LLE without parasthesias.  Has been taking prednisone and prescribed Rxs since televisit couple days ago without any relief.

## 2020-05-11 NOTE — ED Provider Notes (Signed)
[facility]    CSN: [id number] Arrival date & time: 05/11/20  1127      History   Chief Complaint Chief Complaint  Patient presents with  . Back Pain    HPI Bobby Miles is a 60 y.o. male presenting with back pain for 6 days. 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 States this is the worst back pain he's ever had. Denies trauma, though was doing some exercises 6 days ago, and states the pain started after this. Pt was seen via [facility] telehealth visit 3 days ago and was started on Prednisone taper, flexeril, voltaren PO with minimal relief in symptoms. He presents today for further evaluation of back pain. States he's had 8/10 pain with standing and moving. Also experiences significant pain at rest. Pain radiates down left leg. Also with brief numbness of outer L thigh when standing but this goes away with movement or sitting. States pain of back has actually improved in the last 2 days- was experiencing paraspinous muscle tenderness L mid and lower back, but now this is just of L lower back. Denies pain shooting down R leg, denies numbness in arms/ Rleg / inner L leg, denies weakness in arms/legs, denies saddle anesthesia, denies bowel/bladder incontinence. Denies urinary or bowel symptoms.    HPI  Past Medical History:  Diagnosis Date  . Back pain   . Meningitis spinal    as child    Patient Active Problem List   Diagnosis Date Noted  . Chronic pain of both shoulders 05/24/2017  . Acute left-sided low back pain without sciatica 10/29/2015    Past Surgical History:  Procedure Laterality Date  . ELBOW SURGERY    . KNEE SURGERY    . SHOULDER SURGERY         Home Medications    Prior to Admission medications   Medication Sig Start Date End Date Taking? Authorizing Provider  cyclobenzaprine (FLEXERIL) 10 MG tablet Take 1 tablet (10 mg total) by mouth 3 (three) times daily as needed for muscle spasms. 05/09/20  Yes Mayers, Cari S,  PA-C  diclofenac (VOLTAREN) 75 MG EC tablet Take 1 tablet (75 mg total) by mouth 2 (two) times daily as needed. 05/09/20  Yes Mayers, Cari S, PA-C  HYDROcodone-acetaminophen (NORCO/VICODIN) 5-325 MG tablet Take 2 tablets by mouth every 4 (four) hours as needed. 05/11/20  Yes Rhys Martini, PA-C  metaxalone (SKELAXIN) 800 MG tablet Take 1 tablet (800 mg total) by mouth 3 (three) times daily. 05/11/20  Yes Rhys Martini, PA-C  predniSONE (DELTASONE) 20 MG tablet Take 3 tablets (60 mg total) by mouth daily with breakfast for 2 days, THEN 2 tablets (40 mg total) daily with breakfast for 2 days, THEN 1 tablet (20 mg total) daily with breakfast for 2 days, THEN 0.5 tablets (10 mg total) daily with breakfast for 2 days. 05/09/20 05/17/20 Yes Mayers, Cari S, PA-C    Family History Family History  Problem Relation Age of Onset  . CAD Father   . Cancer Father     Social History Social History   Tobacco Use  . Smoking status: Never Smoker  . Smokeless tobacco: Never Used  Vaping Use  . Vaping Use: Never used  Substance Use Topics  . Alcohol use: Yes    Alcohol/week: 25.0 standard drinks    Types: 25 Cans of beer per week  . Drug use: Never     Allergies   Patient has no known  allergies.   Review of Systems Review of Systems  Musculoskeletal: Positive for back pain.  All other systems reviewed and are negative.    Physical Exam Triage Vital Signs ED Triage Vitals  Enc Vitals Group     BP 05/11/20 1137 (!) 130/91     Pulse Rate 05/11/20 1137 91     Resp 05/11/20 1137 16     Temp 05/11/20 1137 (!) 97.5 F (36.4 C)     Temp Source 05/11/20 1137 Oral     SpO2 05/11/20 1137 100 %     Weight --      Height --      Head Circumference --      Peak Flow --      Pain Score 05/11/20 1139 8     Pain Loc --      Pain Edu? --      Excl. in GC? --    No data found.  Updated Vital Signs BP (!) 130/91   Pulse 91   Temp (!) 97.5 F (36.4 C) (Oral)   Resp 16   SpO2 100%    Visual Acuity Right Eye Distance:   Left Eye Distance:   Bilateral Distance:    Right Eye Near:   Left Eye Near:    Bilateral Near:     Physical Exam Vitals reviewed.  Constitutional:      General: He is not in acute distress.    Appearance: Normal appearance. He is not ill-appearing.  HENT:     Head: Normocephalic and atraumatic.  Cardiovascular:     Rate and Rhythm: Normal rate and regular rhythm.     Heart sounds: Normal heart sounds.  Pulmonary:     Effort: Pulmonary effort is normal.     Breath sounds: Normal breath sounds and air entry.  Abdominal:     Tenderness: There is no abdominal tenderness. There is no right CVA tenderness, left CVA tenderness, guarding or rebound.     Comments: No bowel or bladder incontinence.  Musculoskeletal:     Cervical back: Normal range of motion. No swelling, deformity, signs of trauma, rigidity, spasms, tenderness, bony tenderness or crepitus. No pain with movement.     Thoracic back: No swelling, deformity, signs of trauma, spasms, tenderness or bony tenderness. Normal range of motion. No scoliosis.     Lumbar back: Spasms and tenderness present. No swelling, deformity, signs of trauma or bony tenderness. Normal range of motion. Positive left straight leg raise test. Negative right straight leg raise test. No scoliosis.     Comments: Strength 5/5 in UEs and LEs. L paraspinous muscle tenderness of lumbar spine to deep palpation. No spinous tenderness, no bony abnormality. Positive L straight leg raise. Strength 5/5 in UEs and LEs. CN 2-12 grossly intact. Sensation intact LEs.   Neurological:     General: No focal deficit present.     Mental Status: He is alert.     Cranial Nerves: No cranial nerve deficit.     Comments: Strength 5/5 in UEs and LEs. Gait normal. Sensation intact in UEs and LEs.   Psychiatric:        Mood and Affect: Mood normal.        Behavior: Behavior normal.        Thought Content: Thought content normal.         Judgment: Judgment normal.      UC Treatments / Results  Labs (all labs ordered are listed, but only abnormal results are displayed)  Labs Reviewed - No data to display  EKG   Radiology No results found.  Procedures Procedures (including critical care time)  Medications Ordered in UC Medications - No data to display  Initial Impression / Assessment and Plan / UC Course  I have reviewed the triage vital signs and the nursing notes.  Pertinent labs & imaging results that were available during my care of the patient were reviewed by me and considered in my medical decision making (see chart for details).     Continue with prednisone as prescribed previously. Stop Flexeril and try Metaxalone up to 3x daily. 5 pills of Vicodin sent as below. PDMP reviewed. Discussed that we do not treat chronic back pain here and that he must follow-up with orthopedist or spine specialist (information provided) if he does not experience improvement in pain with prednisone/muscle relaxer over the next 2-3 days. Patient verbalizes understanding and agreement.   Head straight to ED if bowel/bladder dysfunction, saddle anesthesia, etc.   Final Clinical Impressions(s) / UC Diagnoses   Final diagnoses:  Sciatica of left side  Strain of lumbar region, subsequent encounter     Discharge Instructions     -Continue taking the prednisone as directed -You can continue the Flexeril up to 3x daily (every 8 hours) , or stop Flexeril and try Metaxalone (Skelaxin) every 8 hours. Both of these medications are muscle relaxers.  -Use the hydrocodone-acetaminophen (Norco/Vicodin) every 4 hours as needed. I sent 5 pills of this. Dont take this before driving, operating machinery, drinking alcohol, etc.  -Continue to walk around your house and do gentle range of motion exercises as possible -Continue using ice/heat -If your symptoms worsen/persist, make an appointment with orthopedist (information below) or spine  specialist (information below). You can also walk into [facility]- they're an orthopedic urgent care.     ED Prescriptions    Medication Sig Dispense Auth. Provider   metaxalone (SKELAXIN) 800 MG tablet Take 1 tablet (800 mg total) by mouth 3 (three) times daily. 21 tablet Rhys Martini, PA-C   HYDROcodone-acetaminophen (NORCO/VICODIN) 5-325 MG tablet Take 2 tablets by mouth every 4 (four) hours as needed. 5 tablet Rhys Martini, PA-C     I have reviewed the PDMP during this encounter.   Rhys Martini, PA-C 05/11/20 1300

## 2020-05-11 NOTE — Discharge Instructions (Addendum)
-  Continue taking the prednisone as directed -You can continue the Flexeril up to 3x daily (every 8 hours) , or stop Flexeril and try Metaxalone (Skelaxin) every 8 hours. Both of these medications are muscle relaxers.  -Use the hydrocodone-acetaminophen (Norco/Vicodin) every 4 hours as needed. I sent 5 pills of this. Dont take this before driving, operating machinery, drinking alcohol, etc.  -Continue to walk around your house and do gentle range of motion exercises as possible -Continue using ice/heat -If your symptoms worsen/persist, make an appointment with orthopedist (information below) or spine specialist (information below). You can also walk into [facility]- they're an orthopedic urgent care.

## 2020-05-11 NOTE — Addendum Note (Signed)
Addended by: Toleen Lachapelle S on: 05/11/2020 01:41 PM   Modules accepted: Level of Service  

## 2020-05-12 ENCOUNTER — Encounter: Payer: Self-pay | Admitting: Physician Assistant

## 2020-05-12 ENCOUNTER — Encounter: Payer: Self-pay | Admitting: Family Medicine

## 2020-05-12 ENCOUNTER — Ambulatory Visit: Payer: BC Managed Care – PPO | Admitting: Family Medicine

## 2020-05-12 VITALS — BP 130/84 | Ht 73.0 in | Wt 200.0 lb

## 2020-05-12 DIAGNOSIS — M545 Low back pain, unspecified: Secondary | ICD-10-CM | POA: Diagnosis not present

## 2020-05-12 MED ORDER — OXYCODONE-ACETAMINOPHEN 7.5-325 MG PO TABS: 1.0000 | ORAL_TABLET | ORAL | 0 refills | Status: DC | PRN

## 2020-05-12 MED ORDER — KETOROLAC TROMETHAMINE 60 MG/2ML IM SOLN
60.0000 mg | Freq: Once | INTRAMUSCULAR | Status: AC
  Administered 2020-05-12: 60 mg via INTRAMUSCULAR

## 2020-05-12 MED ORDER — METHYLPREDNISOLONE ACETATE 80 MG/ML IJ SUSP
80.0000 mg | Freq: Once | INTRAMUSCULAR | Status: AC
  Administered 2020-05-12: 80 mg via INTRAMUSCULAR

## 2020-05-12 NOTE — Patient Instructions (Signed)
You have lumbar radiculopathy (a pinched nerve in your low back). We will go ahead with an MRI of your lumbar spine given the progression and lack of patellar reflex. Continue your prednisone as you have been. You were given an IM injection of toradol and depomedrol. Percocet as needed for severe pain (no driving on this medicine and do NOT take tylenol while on this). I will call you with results and next steps.

## 2020-05-12 NOTE — Progress Notes (Signed)
    SUBJECTIVE:   CHIEF COMPLAINT / HPI: Acute back pain  Bobby Miles is a 60 year old gentleman presenting for evaluation of acute worsening low back pain.  He reports this initially started approximately 6 days ago after he bent over to put on his socks in the morning.  Had immediate excruciating pain on the left side of his low back going into his left leg.  Otherwise no preceding injury/trauma, was doing his usual stretching exercises the day prior.  He was seen at urgent care twice already, initially via telemedicine and prescribed prednisone taper, then yesterday, 1/23, in-person.  He was prescribed Skelaxin and short course of Norco, both of which have helped some but not substantially.  Today he reports the severity of pain continues to get worse and has not noticed any improvement from the regimen above.  He has had extreme difficulty sleeping due to the discomfort.  He has to walk in a shuffle and bent forward while walking to avoid significant pain.  Pain worse with lifting up his left leg and back extension, leaning forward helps.  Notes tingly sensation down his left leg but will often go down to his toes, no associated numbness.  Denies any bowel/bladder incontinence, saddle anesthesia/numbness anywhere, or muscle weakness.  He has not had anything like this before, however does note chronic low back pain.  PERTINENT  PMH / PSH: Chronic back pain   OBJECTIVE:   BP 130/84   Ht 6\' 1"  (1.854 m)   Wt 200 lb (90.7 kg)   BMI 26.39 kg/m   General: Alert, NAD at rest, uncomfortable with movements  HEENT: NCAT, MMM Lungs: No increased WOB   Lumbar spine: - Inspection: no gross deformity or asymmetry, swelling or ecchymosis - Palpation: No TTP over the spinous processes, however tender over left paraspinal muscles - ROM: full active ROM of the lumbar spine in flexion, limited in extension due to eliciting pain. - Strength: 5/5 strength of lower extremity in L4-S1 nerve root distributions  b/l; bent over gait with shuffling of feet - Neuro: sensation intact in the L4-S1 nerve root distribution, however reports hypersensitive along left leg.  2/4 L4/S1 reflexes on the right.  0/4 L4 reflex on the left, 2/4 S1 reflex on the left.  - Special testing: Left straight leg raise eliciting significant low back pain without any radicular symptoms, Negative FABER/FADIR  ASSESSMENT/PLAN:   Acute lumbar back pain with radiculopathy: Worsening.  Significant and worsening severity and pain over the past several days despite current prednisone course, muscle relaxers, ice/heat, and Norco.  Suspect radicular symptoms likely from L4-L5 level with minimal to no left patellar reflex.  Reassuringly he has preserved strength without any additional s/sx concerning for cauda equina.  Given his presentation with worsening in severity, will proceed with MRI lumbar to rule out significant herniation requiring surgical intervention. Provided IM injection of Depo-Medrol/Toradol in the office today.  May finish prednisone taper and continue skelaxin PRN, Rx'd Percocet 7.5-325 mg PRN 5-day course (has 2 tablets left of Norco prescribed at [facility]).   ED precautions discussed, will touch base with patient after MRI to discuss further plan.  [facility], DO [facility]

## 2020-05-13 ENCOUNTER — Ambulatory Visit: Payer: BC Managed Care – PPO | Admitting: Sports Medicine

## 2020-05-14 NOTE — Addendum Note (Signed)
Addended by: Rutha Bouchard E on: 05/14/2020 09:49 AM   Modules accepted: Orders

## 2020-05-19 ENCOUNTER — Telehealth: Payer: Self-pay | Admitting: Sports Medicine

## 2020-05-19 ENCOUNTER — Other Ambulatory Visit: Payer: Self-pay | Admitting: Sports Medicine

## 2020-05-19 MED ORDER — OXYCODONE-ACETAMINOPHEN 7.5-325 MG PO TABS: 1.0000 | ORAL_TABLET | ORAL | 0 refills | Status: DC | PRN

## 2020-05-19 NOTE — Telephone Encounter (Signed)
  Patient was notified via telephone today of MRI findings of his lumbar spine.  MRI was ordered by Dr. Pearletha Forge.  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  His level of pain is such that he is requiring narcotics.  Review of the office notes shows an absence of a left patellar reflex on exam.  Therefore, I recommend a referral to Dr. Yevette Edwards to discuss other treatment options including surgery if warranted.  We will defer further work-up and treatment to the discretion of Dr. Yevette Edwards and the patient will follow up with [place] as needed.

## 2020-05-21 ENCOUNTER — Other Ambulatory Visit: Payer: Self-pay | Admitting: Orthopedic Surgery

## 2020-05-21 DIAGNOSIS — M5416 Radiculopathy, lumbar region: Secondary | ICD-10-CM

## 2020-05-22 ENCOUNTER — Encounter: Payer: Self-pay | Admitting: Sports Medicine

## 2020-05-24 ENCOUNTER — Other Ambulatory Visit: Payer: BC Managed Care – PPO

## 2020-09-18 ENCOUNTER — Other Ambulatory Visit: Payer: Self-pay | Admitting: Orthopedic Surgery

## 2020-09-18 DIAGNOSIS — M5416 Radiculopathy, lumbar region: Secondary | ICD-10-CM

## 2020-11-27 ENCOUNTER — Ambulatory Visit
Admission: RE | Admit: 2020-11-27 | Discharge: 2020-11-27 | Disposition: A | Discharge status: Home / Self Care | Payer: BC Managed Care – PPO | Source: Ambulatory Visit | Attending: Orthopedic Surgery | Admitting: Orthopedic Surgery

## 2020-11-27 DIAGNOSIS — M5416 Radiculopathy, lumbar region: Secondary | ICD-10-CM

## 2023-02-02 ENCOUNTER — Emergency Department (HOSPITAL_COMMUNITY): Payer: BC Managed Care – PPO

## 2023-02-02 ENCOUNTER — Encounter (HOSPITAL_COMMUNITY): Payer: Self-pay | Admitting: *Deleted

## 2023-02-02 ENCOUNTER — Other Ambulatory Visit: Payer: Self-pay

## 2023-02-02 ENCOUNTER — Emergency Department (HOSPITAL_COMMUNITY)
Admission: EM | Admit: 2023-02-02 | Discharge: 2023-02-02 | Disposition: A | Discharge status: Home / Self Care | Payer: BC Managed Care – PPO | Attending: Emergency Medicine | Admitting: Emergency Medicine

## 2023-02-02 DIAGNOSIS — R4182 Altered mental status, unspecified: Secondary | ICD-10-CM | POA: Insufficient documentation

## 2023-02-02 DIAGNOSIS — R42 Dizziness and giddiness: Secondary | ICD-10-CM | POA: Diagnosis not present

## 2023-02-02 DIAGNOSIS — T50905A Adverse effect of unspecified drugs, medicaments and biological substances, initial encounter: Secondary | ICD-10-CM

## 2023-02-02 DIAGNOSIS — T50995A Adverse effect of other drugs, medicaments and biological substances, initial encounter: Secondary | ICD-10-CM | POA: Diagnosis not present

## 2023-02-02 LAB — RAPID URINE DRUG SCREEN, HOSP PERFORMED
Amphetamines: NOT DETECTED
Barbiturates: NOT DETECTED
Benzodiazepines: NOT DETECTED
Cocaine: NOT DETECTED
Opiates: NOT DETECTED
Tetrahydrocannabinol: NOT DETECTED

## 2023-02-02 LAB — BASIC METABOLIC PANEL
Anion gap: 10 (ref 5–15)
BUN: 14 mg/dL (ref 6–20)
CO2: 23 mmol/L (ref 22–32)
Calcium: 8.7 mg/dL — ABNORMAL LOW (ref 8.9–10.3)
Chloride: 104 mmol/L (ref 98–111)
Creatinine, Ser: 0.88 mg/dL (ref 0.61–1.24)
GFR, Estimated: >60 mL/min (ref >60)
Glucose, Bld: 113 mg/dL — ABNORMAL HIGH (ref 70–99)
Potassium: 4 mmol/L (ref 3.5–5.1)
Sodium: 137 mmol/L (ref 135–145)

## 2023-02-02 LAB — CBC WITH DIFFERENTIAL/PLATELET
Abs Immature Granulocytes: 0.03 10*3/uL (ref 0.00–0.07)
Basophils Absolute: 0.1 10*3/uL (ref 0.0–0.1)
Basophils Relative: 1 %
Eosinophils Absolute: 0.1 10*3/uL (ref 0.0–0.5)
Eosinophils Relative: 2 %
HCT: 47.2 % (ref 39.0–52.0)
Hemoglobin: 16.4 g/dL (ref 13.0–17.0)
Immature Granulocytes: 1 %
Lymphocytes Relative: 28 %
Lymphs Abs: 1.7 10*3/uL (ref 0.7–4.0)
MCH: 30.9 pg (ref 26.0–34.0)
MCHC: 34.7 g/dL (ref 30.0–36.0)
MCV: 88.9 fL (ref 80.0–100.0)
Monocytes Absolute: 0.6 10*3/uL (ref 0.1–1.0)
Monocytes Relative: 9 %
Neutro Abs: 3.7 10*3/uL (ref 1.7–7.7)
Neutrophils Relative %: 59 %
Platelets: 197 10*3/uL (ref 150–400)
RBC: 5.31 MIL/uL (ref 4.22–5.81)
RDW: 13.2 % (ref 11.5–15.5)
WBC: 6.1 10*3/uL (ref 4.0–10.5)
nRBC: 0 % (ref 0.0–0.2)

## 2023-02-02 LAB — ETHANOL: Alcohol, Ethyl (B): <10 mg/dL (ref <10)

## 2023-02-02 LAB — TROPONIN I (HIGH SENSITIVITY): Troponin I (High Sensitivity): <2 ng/L (ref <18)

## 2023-02-02 MED ORDER — IOHEXOL 350 MG/ML SOLN
75.0000 mL | Freq: Once | INTRAVENOUS | Status: AC | PRN
  Administered 2023-02-02: 75 mL via INTRAVENOUS

## 2023-02-02 MED ORDER — SODIUM CHLORIDE 0.9 % IV BOLUS
1000.0000 mL | Freq: Once | INTRAVENOUS | Status: AC
  Administered 2023-02-02: 1000 mL via INTRAVENOUS

## 2023-02-02 MED ORDER — SODIUM CHLORIDE (PF) 0.9 % IJ SOLN
INTRAMUSCULAR | Status: AC
  Filled 2023-02-02: qty 50

## 2023-02-02 NOTE — ED Triage Notes (Addendum)
BIB POV after episode of double vision, feeling dizzy, pt stated to triage nurse he feels disoriented. Pt [facility] 0 in triage. Pt keeps saying he feels like he needs to take a nap.

## 2023-02-02 NOTE — Discharge Instructions (Addendum)
Like we discussed, it only take one of your medications next time you use it.  Make sure you are getting plenty to eat and drink over the next few days.  Please follow-up with your primary care doctor in a week.  Your head and neck CT scan was completely normal.

## 2023-02-02 NOTE — ED Provider Notes (Signed)
[place] EMERGENCY DEPARTMENT AT [facility] Provider Note   CSN: [id number] Arrival date & time: 02/02/23  1207     History  Chief Complaint  Patient presents with   Altered Mental Status   Code Stroke    Bobby Miles is a 62 y.o. male.  This is a 62 year old male who presents emergency department today due to a period of "feeling out of it".  Patient says that he was doing work at home, and began to feel lightheaded.  He says that about 30 minutes prior to feeling this way he took 2 erectile dysfunction pills which were prescribed to him.  He is not sure which ones they were.  He had not used them before.  I reviewed the patient's medication list, confirmed them with him at bedside.   Altered Mental Status      Home Medications Prior to Admission medications   Medication Sig Start Date End Date Taking? Authorizing Provider  MOTRIN IB 200 MG CAPS Take 200-600 mg by mouth every 6 (six) hours as needed (for pain or headaches).   Yes [provider]  Sildenafil Citrate (VIAGRA PO) Take 1 tablet by mouth daily as needed (for E.D. or as otherwise instructed by the provider).   Yes [provider]  zolpidem (AMBIEN) 10 MG tablet Take 5-10 mg by mouth at bedtime as needed for sleep.   Yes [provider]  cyclobenzaprine (FLEXERIL) 10 MG tablet Take 1 tablet (10 mg total) by mouth 3 (three) times daily as needed for muscle spasms. Patient not taking: Reported on 02/02/2023 05/09/20   Mayers, Kasandra Knudsen, PA-C  diclofenac (VOLTAREN) 75 MG EC tablet Take 1 tablet (75 mg total) by mouth 2 (two) times daily as needed. Patient not taking: Reported on 02/02/2023 05/09/20   Mayers, Kasandra Knudsen, PA-C  metaxalone (SKELAXIN) 800 MG tablet Take 1 tablet (800 mg total) by mouth 3 (three) times daily. Patient not taking: Reported on 02/02/2023 05/11/20   Rhys Martini, PA-C  oxyCODONE-acetaminophen (PERCOCET) 7.5-325 MG tablet Take 1-2 tablets by mouth every 4  (four) hours as needed for severe pain. Patient not taking: Reported on 02/02/2023 05/19/20   Ralene Cork, DO      Allergies    Patient has no known allergies.    Review of Systems   Review of Systems  Physical Exam Updated Vital Signs BP 138/89   Pulse 88   Temp 97.9 F (36.6 C)   Resp 12   SpO2 99%  Physical Exam Vitals and nursing note reviewed.  Cardiovascular:     Rate and Rhythm: Normal rate.  Skin:    General: Skin is warm and dry.  Neurological:     General: No focal deficit present.     Mental Status: He is alert and oriented to person, place, and time.     Cranial Nerves: No cranial nerve deficit.     Sensory: No sensory deficit.     Motor: No weakness.     Coordination: Coordination normal.     Gait: Gait normal.     Deep Tendon Reflexes: Reflexes normal.     Comments: Patient able to sit up in the bed unassisted.  No pronator drift.  5 of 5 strength in bilateral upper and lower extremities.  Intact sensation of bilateral upper and lower extremities.  Patient able to stand.  Gait deferred.     ED Results / Procedures / Treatments   Labs (all labs ordered are listed,  but only abnormal results are displayed) Labs Reviewed  BASIC METABOLIC PANEL - Abnormal; Notable for the following components:      Result Value   Glucose, Bld 113 (*)    Calcium 8.7 (*)    All other components within normal limits  CBC WITH DIFFERENTIAL/PLATELET  ETHANOL  RAPID URINE DRUG SCREEN, HOSP PERFORMED  TROPONIN I (HIGH SENSITIVITY)    EKG None  Radiology No results found.  Procedures Procedures    Medications Ordered in ED Medications  sodium chloride 0.9 % bolus 1,000 mL (1,000 mLs Intravenous New Bag/Given 02/02/23 1234)  iohexol (OMNIPAQUE) 350 MG/ML injection 75 mL (75 mLs Intravenous Contrast Given 02/02/23 1429)    ED Course/ Medical Decision Making/ A&P                                 Medical Decision Making This is a 62 year old male here today  after period of "feeling out of it" and lightheadedness.  Differential diagnoses include PDE 5 medications, CVA, TIA.  Plan-having the patient's symptoms are explained by the PDE 5 medications that he took earlier in the day.  This is his first time taking these medications, he is unsure of the dosing.  Likely experiencing some transient effects.  Patient has no neurological deficits on my exam.  He does endorse seeing a chiropractor, will obtain imaging of the patient's head and neck.  Last saw the chiropractor several days ago.  Lower suspicion for a dissection.  Patient had an EEG done in 2022 which did not show any seizure-like activities.  Reassessment 4 PM-patient is at his baseline.  He is ambulatory, not have any visual trouble, not feeling lightheaded.  I do think that this was related to his medications.  Patient tells me that he can take "1, 2, or 3 "of his medications.  He stated that he not had much to eat or drink today.  Advised patient to only take 1 of those pills in the future, to discuss it with his primary care doctor.  My independent review of the patient's head CT does not show any intracranial hemorrhage.  Formal read does not show any stenosis.  Will discharge.  Amount and/or Complexity of Data Reviewed Labs: ordered. Radiology: ordered.  Risk Prescription drug management.           Final Clinical Impression(s) / ED Diagnoses Final diagnoses:  Adverse effect of drug, initial encounter    Rx / DC Orders ED Discharge Orders     None         Arletha Pili, DO 02/02/23 1639

## 2023-02-14 ENCOUNTER — Encounter: Payer: Self-pay | Admitting: Internal Medicine

## 2023-02-22 ENCOUNTER — Ambulatory Visit (AMBULATORY_SURGERY_CENTER): Payer: BC Managed Care – PPO

## 2023-02-22 ENCOUNTER — Encounter: Payer: Self-pay | Admitting: Internal Medicine

## 2023-02-22 VITALS — Ht 73.0 in | Wt 200.0 lb

## 2023-02-22 DIAGNOSIS — Z1211 Encounter for screening for malignant neoplasm of colon: Secondary | ICD-10-CM

## 2023-02-22 MED ORDER — NA SULFATE-K SULFATE-MG SULF 17.5-3.13-1.6 GM/177ML PO SOLN
1.0000 | Freq: Once | ORAL | 0 refills | Status: AC
Start: 2023-02-22 — End: 2023-02-22

## 2023-02-22 NOTE — Progress Notes (Signed)

## 2023-03-02 ENCOUNTER — Encounter: Payer: Self-pay | Admitting: Internal Medicine

## 2023-03-02 ENCOUNTER — Ambulatory Visit: Payer: BC Managed Care – PPO | Admitting: Internal Medicine

## 2023-03-02 VITALS — BP 116/89 | HR 69 | Temp 99.5°F | Resp 9 | Ht 73.0 in | Wt 200.0 lb

## 2023-03-02 DIAGNOSIS — D120 Benign neoplasm of cecum: Secondary | ICD-10-CM

## 2023-03-02 DIAGNOSIS — D122 Benign neoplasm of ascending colon: Secondary | ICD-10-CM

## 2023-03-02 DIAGNOSIS — Z1211 Encounter for screening for malignant neoplasm of colon: Secondary | ICD-10-CM | POA: Diagnosis present

## 2023-03-02 DIAGNOSIS — K639 Disease of intestine, unspecified: Secondary | ICD-10-CM | POA: Diagnosis not present

## 2023-03-02 MED ORDER — SODIUM CHLORIDE 0.9 % IV SOLN: 500.0000 mL | INTRAVENOUS | Status: DC

## 2023-03-02 NOTE — Progress Notes (Signed)
Called to room to assist during endoscopic procedure.  Patient ID and intended procedure confirmed with present staff. Received instructions for my participation in the procedure from the performing physician.  

## 2023-03-02 NOTE — Patient Instructions (Signed)
Discharge instructions given. Handouts on polyps and Diverticulosis. Resume previous medications. YOU HAD AN ENDOSCOPIC PROCEDURE TODAY AT THE [facility]:   Refer to the procedure report that was given to you for any specific questions about what was found during the examination.  If the procedure report does not answer your questions, please call your gastroenterologist to clarify.  If you requested that your care partner not be given the details of your procedure findings, then the procedure report has been included in a sealed envelope for you to review at your convenience later.  YOU SHOULD EXPECT: Some feelings of bloating in the abdomen. Passage of more gas than usual.  Walking can help get rid of the air that was put into your GI tract during the procedure and reduce the bloating. If you had a lower endoscopy (such as a colonoscopy or flexible sigmoidoscopy) you may notice spotting of blood in your stool or on the toilet paper. If you underwent a bowel prep for your procedure, you may not have a normal bowel movement for a few days.  Please Note:  You might notice some irritation and congestion in your nose or some drainage.  This is from the oxygen used during your procedure.  There is no need for concern and it should clear up in a day or so.  SYMPTOMS TO REPORT IMMEDIATELY:  Following lower endoscopy (colonoscopy or flexible sigmoidoscopy):  Excessive amounts of blood in the stool  Significant tenderness or worsening of abdominal pains  Swelling of the abdomen that is new, acute  Fever of 100F or higher  For urgent or emergent issues, a gastroenterologist can be reached at any hour by calling [phone]. Do not use MyChart messaging for urgent concerns.    DIET:  We do recommend a small meal at first, but then you may proceed to your regular diet.  Drink plenty of fluids but you should avoid alcoholic beverages for 24 hours.  ACTIVITY:  You should plan to take it  easy for the rest of today and you should NOT DRIVE or use heavy machinery until tomorrow (because of the sedation medicines used during the test).    FOLLOW UP: Our staff will call the number listed on your records the next business day following your procedure.  We will call around 7:15- 8:00 am to check on you and address any questions or concerns that you may have regarding the information given to you following your procedure. If we do not reach you, we will leave a message.     If any biopsies were taken you will be contacted by phone or by letter within the next 1-3 weeks.  Please call us at [phone] if you have not heard about the biopsies in 3 weeks.    SIGNATURES/CONFIDENTIALITY: You and/or your care partner have signed paperwork which will be entered into your electronic medical record.  These signatures attest to the fact that that the information above on your After Visit Summary has been reviewed and is understood.  Full responsibility of the confidentiality of this discharge information lies with you and/or your care-partner. 

## 2023-03-02 NOTE — Op Note (Signed)
[facility] Patient Name: Bobby Miles Procedure Date: 03/02/2023 2:49 PM MRN: [id number] Endoscopist: Wilhemina Bonito. Marina Goodell , MD, [id number] Age: 62 Referring MD:  Date of Birth: 1960/05/27 Gender: Male Account #: [id number] Procedure:                Colonoscopy with cold snare polypectomy x 2 Indications:              Screening for colorectal malignant neoplasm.                            Reports negative index exam elsewhere about 10                            years ago, Medicines:                Monitored Anesthesia Care Procedure:                Pre-Anesthesia Assessment:                           - Prior to the procedure, a History and Physical                            was performed, and patient medications and                            allergies were reviewed. The patient's tolerance of                            previous anesthesia was also reviewed. The risks                            and benefits of the procedure and the sedation                            options and risks were discussed with the patient.                            All questions were answered, and informed consent                            was obtained. Prior Anticoagulants: The patient has                            taken no anticoagulant or antiplatelet agents. ASA                            Grade Assessment: II - A patient with mild systemic                            disease. After reviewing the risks and benefits,                            the patient was deemed in satisfactory condition to  undergo the procedure.                           After obtaining informed consent, the colonoscope                            was passed under direct vision. Throughout the                            procedure, the patient's blood pressure, pulse, and                            oxygen saturations were monitored continuously. The                            CF [id number] #[id number] was  introduced through the anus                            and advanced to the the cecum, identified by                            appendiceal orifice and ileocecal valve. The                            ileocecal valve, appendiceal orifice, and rectum                            were photographed. The quality of the bowel                            preparation was excellent. The colonoscopy was                            performed without difficulty. The patient tolerated                            the procedure well. The bowel preparation used was                            SUPREP via split dose instruction. Scope In: 2:57:24 PM Scope Out: 3:13:40 PM Scope Withdrawal Time: 0 hours 12 minutes 59 seconds  Total Procedure Duration: 0 hours 16 minutes 16 seconds  Findings:                 Two polyps were found in the ascending colon and                            cecum. The polyps were 1 to 2 mm in size. These                            polyps were removed with a cold snare. Resection                            and retrieval were complete.  Multiple small-mouthed diverticula were found in                            the sigmoid colon.                           Internal hemorrhoids were found during retroflexion. Complications:            No immediate complications. Estimated blood loss:                            None. Estimated Blood Loss:     Estimated blood loss: none. Impression:               - Two 1 to 2 mm polyps in the ascending colon and                            in the cecum, removed with a cold snare. Resected                            and retrieved.                           - Diverticulosis in the sigmoid colon.                           - Internal hemorrhoids. Recommendation:           - Repeat colonoscopy in 7-10 years for surveillance.                           - Patient has a contact number available for                            emergencies. The  signs and symptoms of potential                            delayed complications were discussed with the                            patient. Return to normal activities tomorrow.                            Written discharge instructions were provided to the                            patient.                           - Resume previous diet.                           - Continue present medications.                           - Await pathology results. Wilhemina Bonito. Marina Goodell, MD 03/02/2023 3:21:56 PM This report has been signed electronically.

## 2023-03-02 NOTE — Progress Notes (Signed)
Pt's states no medical or surgical changes since previsit or office visit. 

## 2023-03-02 NOTE — Progress Notes (Signed)
Vss nad trans to pacu 

## 2023-03-02 NOTE — Progress Notes (Signed)
HISTORY OF PRESENT ILLNESS:  Bobby Miles is a 62 y.o. male who sent today for screening colonoscopy.  No complaints  REVIEW OF SYSTEMS:  All non-GI ROS negative except for  Past Medical History:  Diagnosis Date   Back pain    Meningitis spinal    as child    Past Surgical History:  Procedure Laterality Date   ELBOW SURGERY     KNEE SURGERY     SHOULDER SURGERY      Social History Bobby Miles  reports that he has never smoked. He has never used smokeless tobacco. He reports current alcohol use of about 25.0 standard drinks of alcohol per week. He reports that he does not use drugs.  family history includes CAD in his father; Cancer in his father.  No Known Allergies     PHYSICAL EXAMINATION: Vital signs: BP 131/66   Pulse 76   Temp 99.5 F (37.5 C) (Temporal)   Ht 6\' 1"  (1.854 m)   Wt 200 lb (90.7 kg)   SpO2 99%   BMI 26.39 kg/m  General: Well-developed, well-nourished, no acute distress HEENT: Sclerae are anicteric, conjunctiva pink. Oral mucosa intact Lungs: Clear Heart: Regular Abdomen: soft, nontender, nondistended, no obvious ascites, no peritoneal signs, normal bowel sounds. No organomegaly. Extremities: No edema Psychiatric: alert and oriented x3. Cooperative     ASSESSMENT:  Colon cancer screening   PLAN:   Screening colonoscopy

## 2023-03-03 ENCOUNTER — Telehealth: Payer: Self-pay

## 2023-03-03 NOTE — Telephone Encounter (Signed)
  Follow up Call-     03/02/2023    1:45 PM  Call back number  Post procedure Call Back phone  # [phone]  Permission to leave phone message Yes     Patient questions:  Do you have a fever, pain , or abdominal swelling? No. Pain Score  0 *  Have you tolerated food without any problems? Yes.    Have you been able to return to your normal activities? Yes.    Do you have any questions about your discharge instructions: Diet   No. Medications  No. Follow up visit  No.  Do you have questions or concerns about your Care? No.  Actions: * If pain score is 4 or above: No action needed, pain <4.

## 2023-03-09 ENCOUNTER — Encounter: Payer: Self-pay | Admitting: Internal Medicine

## 2023-03-09 LAB — SURGICAL PATHOLOGY
